# Patient Record
Sex: Female | Born: 1975 | Race: White | Hispanic: No | Marital: Single
Health system: Southern US, Community
[De-identification: ages and names within clinical notes are randomized; demographics above are authoritative.]

---

## 2019-02-25 ENCOUNTER — Other Ambulatory Visit: Payer: Self-pay

## 2019-02-25 DIAGNOSIS — Z20822 Contact with and (suspected) exposure to covid-19: Secondary | ICD-10-CM

## 2019-02-27 LAB — NOVEL CORONAVIRUS, NAA: SARS-CoV-2, NAA: DETECTED — AB

## 2021-01-10 ENCOUNTER — Other Ambulatory Visit: Payer: Self-pay | Admitting: Obstetrics and Gynecology

## 2021-01-10 DIAGNOSIS — Z1231 Encounter for screening mammogram for malignant neoplasm of breast: Secondary | ICD-10-CM

## 2021-01-18 ENCOUNTER — Other Ambulatory Visit: Payer: Self-pay

## 2021-01-18 ENCOUNTER — Ambulatory Visit
Admission: RE | Admit: 2021-01-18 | Discharge: 2021-01-18 | Disposition: A | Discharge status: Home / Self Care | Payer: 59 | Source: Ambulatory Visit | Attending: Obstetrics and Gynecology | Admitting: Obstetrics and Gynecology

## 2021-01-18 DIAGNOSIS — Z1231 Encounter for screening mammogram for malignant neoplasm of breast: Secondary | ICD-10-CM | POA: Diagnosis not present

## 2021-01-19 ENCOUNTER — Ambulatory Visit
Admission: RE | Admit: 2021-01-19 | Discharge: 2021-01-19 | Disposition: A | Discharge status: Home / Self Care | Payer: 59 | Source: Ambulatory Visit | Attending: Obstetrics and Gynecology | Admitting: Obstetrics and Gynecology

## 2021-01-19 ENCOUNTER — Other Ambulatory Visit: Payer: Self-pay | Admitting: Obstetrics and Gynecology

## 2021-01-19 DIAGNOSIS — N6320 Unspecified lump in the left breast, unspecified quadrant: Secondary | ICD-10-CM | POA: Insufficient documentation

## 2021-01-19 DIAGNOSIS — N6310 Unspecified lump in the right breast, unspecified quadrant: Secondary | ICD-10-CM | POA: Insufficient documentation

## 2021-01-19 DIAGNOSIS — N63 Unspecified lump in unspecified breast: Secondary | ICD-10-CM

## 2021-09-28 DIAGNOSIS — M5412 Radiculopathy, cervical region: Secondary | ICD-10-CM | POA: Diagnosis not present

## 2021-09-28 DIAGNOSIS — R7303 Prediabetes: Secondary | ICD-10-CM | POA: Diagnosis not present

## 2021-10-05 DIAGNOSIS — M5412 Radiculopathy, cervical region: Secondary | ICD-10-CM | POA: Diagnosis not present

## 2021-10-17 DIAGNOSIS — M542 Cervicalgia: Secondary | ICD-10-CM | POA: Diagnosis not present

## 2021-10-24 DIAGNOSIS — M542 Cervicalgia: Secondary | ICD-10-CM | POA: Diagnosis not present

## 2021-11-09 DIAGNOSIS — Z0000 Encounter for general adult medical examination without abnormal findings: Secondary | ICD-10-CM | POA: Diagnosis not present

## 2021-11-09 DIAGNOSIS — R7303 Prediabetes: Secondary | ICD-10-CM | POA: Diagnosis not present

## 2021-11-25 ENCOUNTER — Other Ambulatory Visit: Payer: Self-pay | Admitting: Family Medicine

## 2021-11-25 DIAGNOSIS — M5412 Radiculopathy, cervical region: Secondary | ICD-10-CM

## 2021-12-02 ENCOUNTER — Ambulatory Visit
Admission: RE | Admit: 2021-12-02 | Discharge: 2021-12-02 | Disposition: A | Discharge status: Home / Self Care | Payer: 59 | Source: Ambulatory Visit | Attending: Family Medicine | Admitting: Family Medicine

## 2021-12-02 DIAGNOSIS — R200 Anesthesia of skin: Secondary | ICD-10-CM | POA: Diagnosis not present

## 2021-12-02 DIAGNOSIS — M5412 Radiculopathy, cervical region: Secondary | ICD-10-CM

## 2022-03-01 ENCOUNTER — Emergency Department: Payer: 59

## 2022-03-01 ENCOUNTER — Other Ambulatory Visit: Payer: Self-pay

## 2022-03-01 ENCOUNTER — Emergency Department
Admission: EM | Admit: 2022-03-01 | Discharge: 2022-03-01 | Disposition: A | Discharge status: Home / Self Care | Payer: 59 | Attending: Emergency Medicine | Admitting: Emergency Medicine

## 2022-03-01 DIAGNOSIS — S6991XA Unspecified injury of right wrist, hand and finger(s), initial encounter: Secondary | ICD-10-CM | POA: Diagnosis not present

## 2022-03-01 DIAGNOSIS — W540XXA Bitten by dog, initial encounter: Secondary | ICD-10-CM | POA: Insufficient documentation

## 2022-03-01 DIAGNOSIS — S61451A Open bite of right hand, initial encounter: Secondary | ICD-10-CM | POA: Insufficient documentation

## 2022-03-01 DIAGNOSIS — S61252A Open bite of right middle finger without damage to nail, initial encounter: Secondary | ICD-10-CM | POA: Diagnosis not present

## 2022-03-01 MED ORDER — AMOXICILLIN-POT CLAVULANATE 875-125 MG PO TABS: 1.0000 | ORAL_TABLET | Freq: Two times a day (BID) | ORAL | 0 refills | Status: AC

## 2022-03-01 NOTE — Discharge Instructions (Addendum)
Please monitor closely for infection. If you are concerned, please return to the ER or see primary care.  Take the antibiotic until finished.

## 2022-03-01 NOTE — ED Triage Notes (Signed)
Pt here with a dog bite on her middle finger on her right hand. Pt states she was feeding a dog and it bit her on accident, pt not concerned about rabies. Pt only here to have her finger evaluated.

## 2022-03-01 NOTE — ED Provider Notes (Signed)
[facility] Provider Note    Event Date/Time   First MD Initiated Contact with Patient 03/01/22 1127     (approximate)   History   Animal Bite   HPI  Paula Wright is a 46 y.o. female with no significant past medical history and as listed in EMR presents to the emergency department for treatment and evaluation of pain to right middle finger after being bitten by a dog she is very familiar with. She was feeding it a pig ear and it didn't quite get it in his mouth, so he moved his head forward and bit down again catching her middle finger. Dog was not aggressive and acting normal. He was just excited to get the treat. She has no concern of rabies. Her tdap is current. Injury occurred yesterday. Pain is well controlled with tylenol and ibuprofen. Finger is mildly swollen today.      Physical Exam   Triage Vital Signs: ED Triage Vitals [03/01/22 1021]  Enc Vitals Group     BP 135/86     Pulse Rate (!) 105     Resp 18     Temp (!) 97.5 F (36.4 C)     Temp Source Oral     SpO2 92 %     Weight 190 lb (86.2 kg)     Height 5\' 4"  (1.626 m)     Head Circumference      Peak Flow      Pain Score 3     Pain Loc      Pain Edu?      Excl. in GC?     Most recent vital signs: Vitals:   03/01/22 1021 03/01/22 1234  BP: 135/86 129/84  Pulse: (!) 105 89  Resp: 18 16  Temp: (!) 97.5 F (36.4 C)   SpO2: 92% 99%    General: Awake, no distress.  CV:  Good peripheral perfusion.  Resp:  Normal effort.  Abd:  No distention.  Other:  Superficial appearing laceration to the nailfold of the right middle finger. No nail injury. Mild localized swelling without erythema. No drainage. ROM of finger decreased due to swelling, but able to demonstrate flexion and extension.   ED Results / Procedures / Treatments   Labs (all labs ordered are listed, but only abnormal results are displayed) Labs Reviewed - No data to display   EKG  Not  indicated.   RADIOLOGY  Image interpreted and radiology report reviewed by me.  Images of the right middle finger shows possible retained foreign body.  PROCEDURES:  Critical Care performed: No  Procedures   MEDICATIONS ORDERED IN ED: Medications - No data to display   IMPRESSION / MDM / ASSESSMENT AND PLAN / ED COURSE   I have reviewed the triage note.  Differential diagnosis includes, but is not limited to, dog bite wound, retained foreign body, tuft fracture.  46 year old female presenting to the emergency department for treatment and evaluation 1 day after being bitten by a dog.  See HPI for further details.  She reports Tdap is current.  Patient adamantly denies concern for rabies or the dog being aggressive.  Based on her report of the injury, no indication for rabies series.  Plan will be to get an image of the finger to ensure that there is no tuft fracture or retained foreign body.  Patient is aware and agreeable to the plan.  There is a radiopaque density on the image of the right middle  finger, however that does not appear related to the dog bite.  Plan will be to discharge the patient home with prescription for antibiotic.  She was encouraged to monitor the finger closely for any concern of infection.  If she notices any drainage, increase in erythema, increasing pain, or other concerns she is to either see primary care or return to the emergency department.        FINAL CLINICAL IMPRESSION(S) / ED DIAGNOSES   Final diagnoses:  Dog bite of right hand, initial encounter     Rx / DC Orders   ED Discharge Orders          Ordered    amoxicillin-clavulanate (AUGMENTIN) 875-125 MG tablet  2 times daily        03/01/22 1139             Note:  This document was prepared using Dragon voice recognition software and may include unintentional dictation errors.   Chinita Pester, FNP 03/01/22 1857    Concha Se, MD 03/03/22 1250

## 2023-06-03 IMAGING — MR MR CERVICAL SPINE W/O CM
4 of 5 series · 26 of 48 positions shown · non-contrast
Comparison: Report from radiographs of the cervical spine
10/05/2021 (images unavailable).

CLINICAL DATA: Provided history: Cervical radiculopathy. Right
shoulder and arm pain. Arm numbness.

EXAM:
MRI CERVICAL SPINE WITHOUT CONTRAST
TECHNIQUE: Multiplanar, multisequence MR imaging of the cervical spine was
performed. No intravenous contrast was administered.

[Series 6: T1 · sagittal · 3.0mm · 0.66mm/px · 6 of 15 slices shown]
[im 1/15]
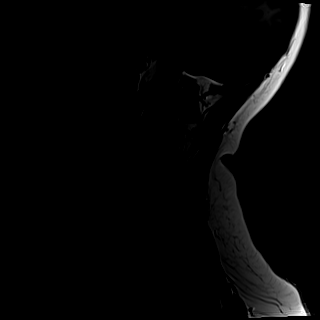
[im 3/15]
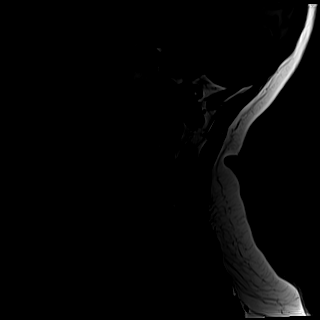
[im 6/15]
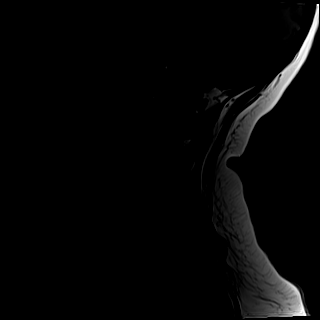
[im 9/15]
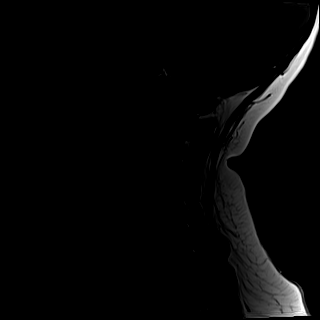
[im 12/15]
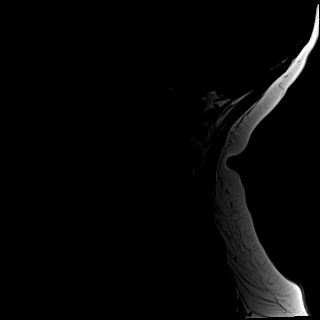
[im 15/15]
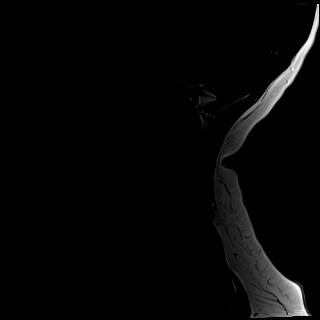

[Series 7: T2 · sagittal · 3.0mm · 0.55mm/px · 7 of 15 slices shown (1 of 2)]
[im 1/15]
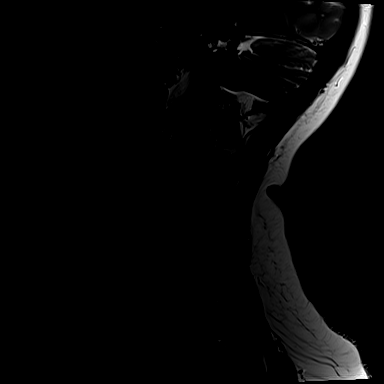
[im 3/15]
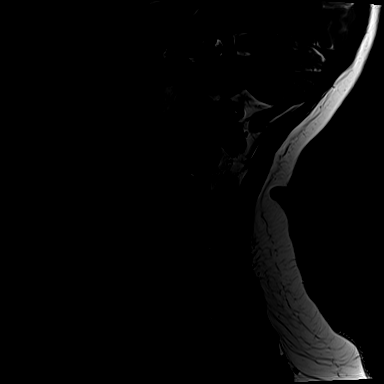
[im 5/15]
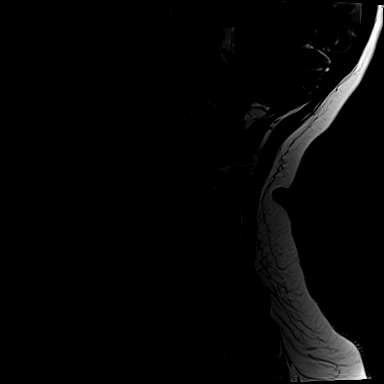
[im 8/15]
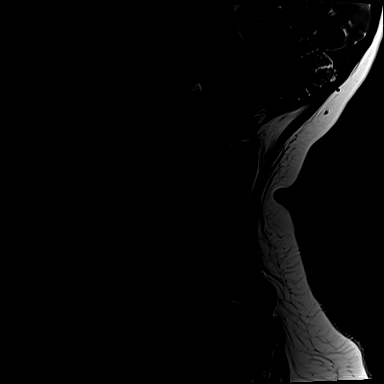
[im 10/15]
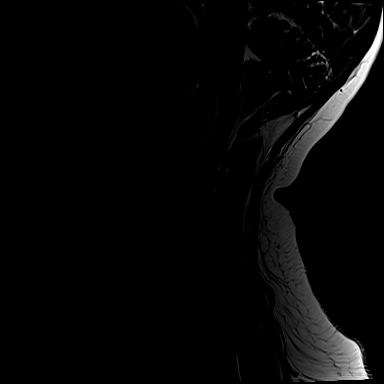
[im 12/15]
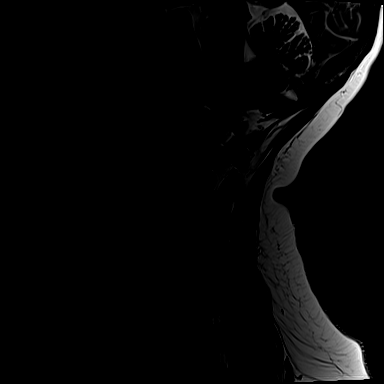
[im 15/15]
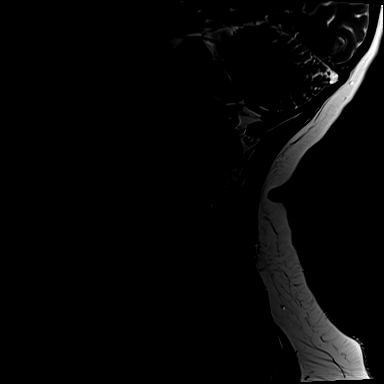

[Series 8: STIR · sagittal · 3.0mm · 0.33mm/px · 5 of 15 slices shown]
[im 1/15]
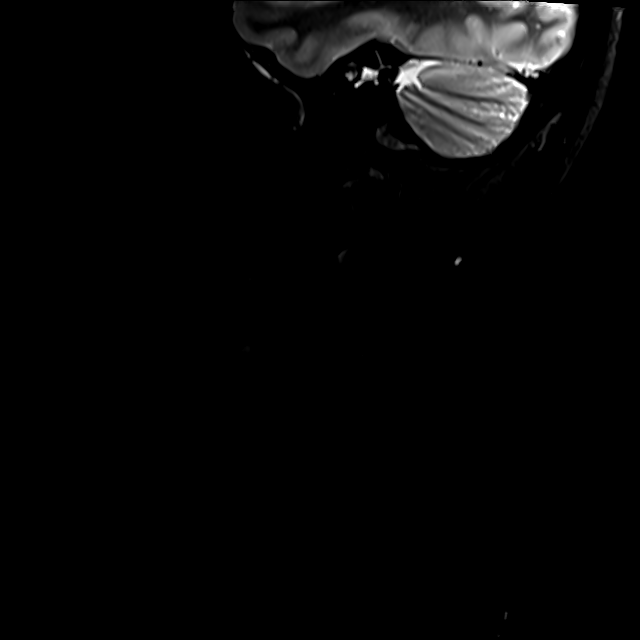
[im 3/15]
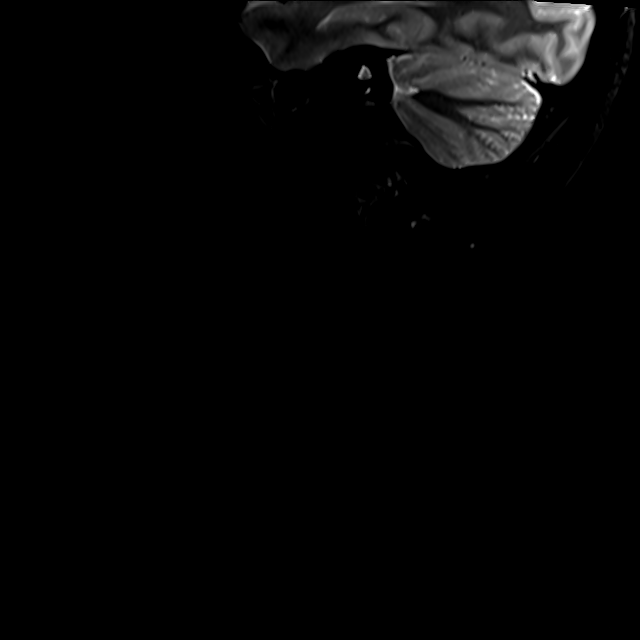
[im 5/15]
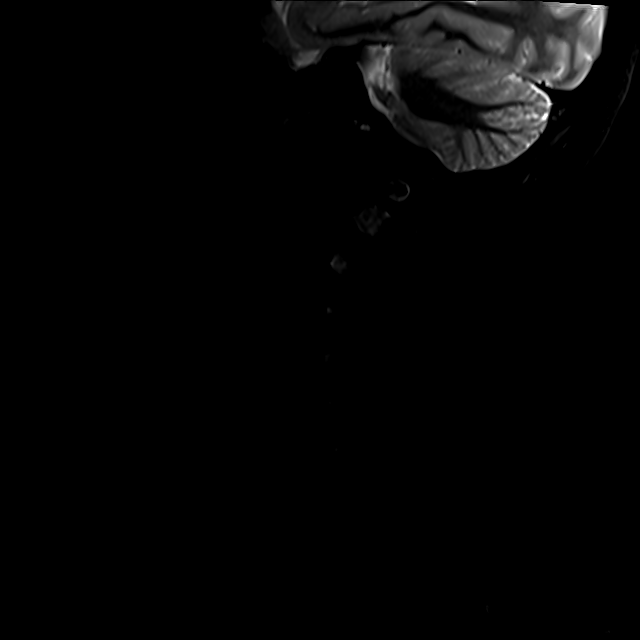
[im 8/15]
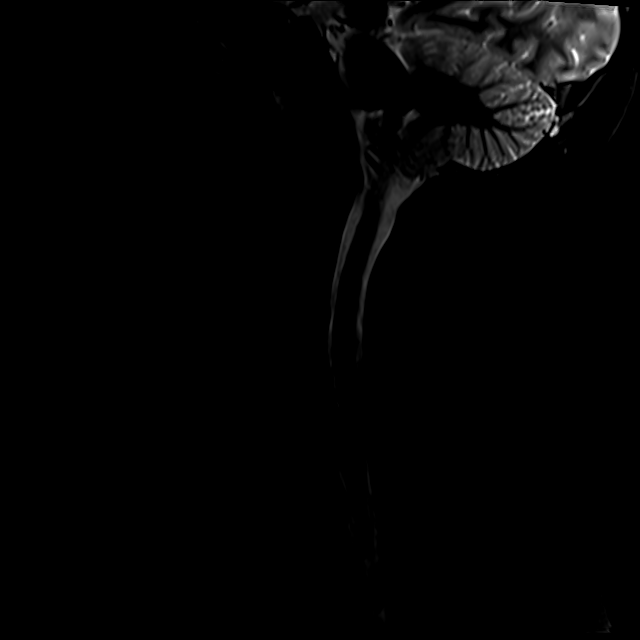
[im 12/15]
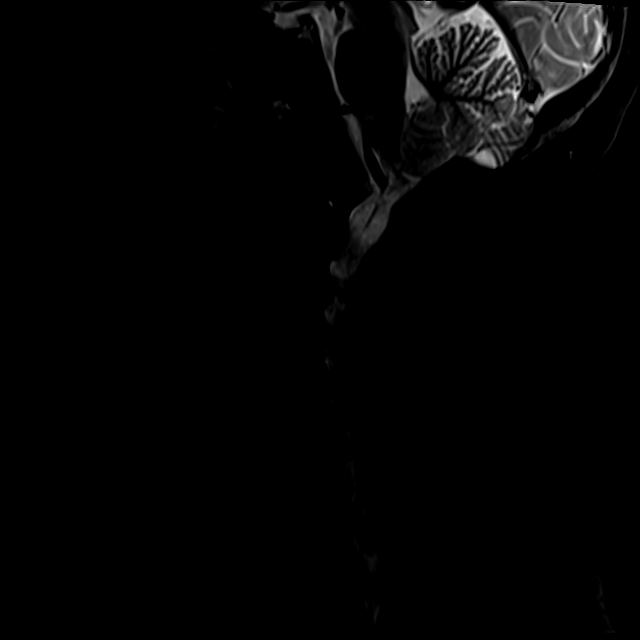

[Series 9: T2 · axial · 3.0mm · 0.50mm/px · z∈[-63,+33]mm · 8 of 32 slices shown (2 of 2)]
[im 1/32]
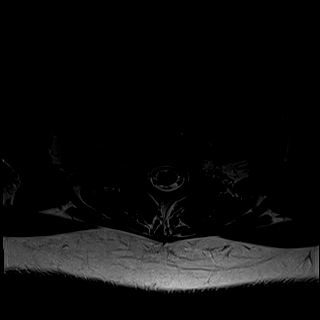
[im 5/32]
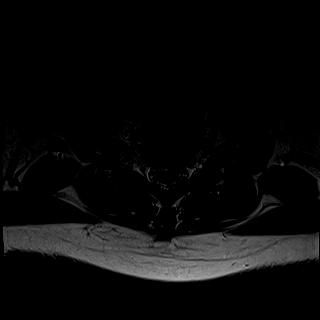
[im 10/32]
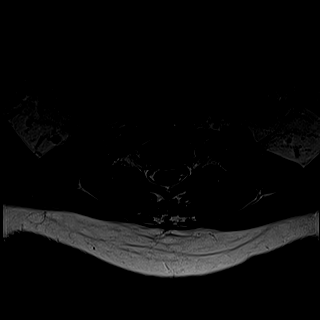
[im 15/32]
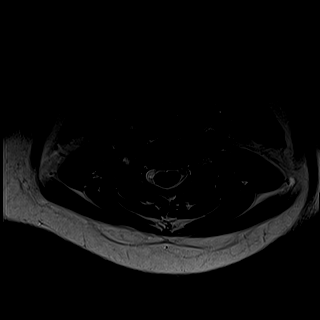
[im 17/32]
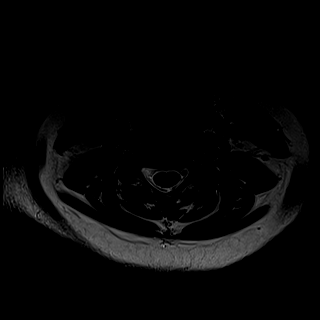
[im 22/32]
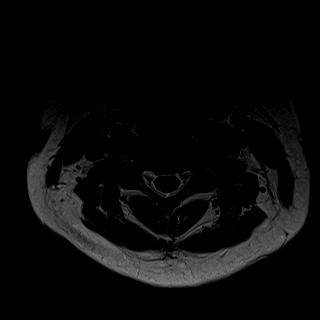
[im 27/32]
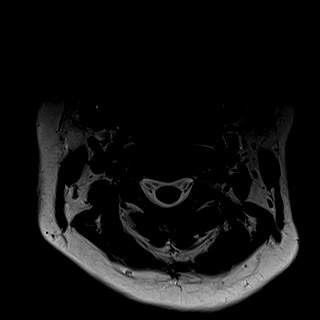
[im 32/32]
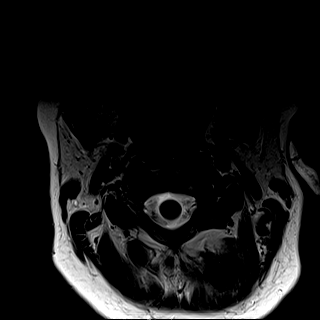

[26 of 48 positions shown; findings below may reference images not displayed]

FINDINGS: Alignment: Straightening of the expected cervical lordosis. No
significant spondylolisthesis.

Vertebrae: Vertebral body height is maintained. No significant
marrow edema or focal suspicious osseous lesion.

Cord: No signal abnormality identified within the cervical spinal
cord. Mild flattening of the ventral spinal cord at C6-C7, as
described below.

Posterior Fossa, vertebral arteries, paraspinal tissues: No
abnormality identified within included portions of the posterior
fossa. Flow voids preserved within the imaged cervical vertebral
arteries. No paraspinal mass or collection.

Disc levels:

Mild multilevel disc degeneration, greatest at C5-C6 and C6-C7.

C2-C3: No significant disc herniation or stenosis.

C3-C4: Shallow disc bulge. No significant spinal canal or foraminal
stenosis.

C4-C5: No significant disc herniation or stenosis.

C5-C6: Disc bulge. Superimposed right center/foraminal disc
extrusion with caudal migration to the mid C6 vertebral body level
(series 7, images 8-6) (series 9, images 20-22). The disc bulge and
disc protrusion result in mild spinal canal narrowing and contact
the ventral spinal cord. The disc extrusion could affect the right
C6 nerve root within the right C5-C6 foraminal entry zone, and/or
the right C7 nerve root within the right C6-C7 foraminal entry zone.
No significant left foraminal stenosis.

C6-C7: A small central disc protrusion mildly narrows the spinal
canal and contacts the ventral aspect of the spinal cord. No
significant foraminal stenosis.

C7-T1: Slight disc bulge. No significant spinal canal or foraminal
stenosis.
IMPRESSION: Cervical spondylosis, as outlined and with findings most notably as
follows.

At C5-C6, there is a disc bulge. Superimposed right center/foraminal
disc extrusion with caudal migration to the mid C6 vertebral body
level. The disc bulge and disc protrusion mildly narrow the spinal
canal and contact the ventral spinal cord. The disc extrusion could
affect the right C6 nerve root within the right C5-C6 foraminal
entry zone and/or the right C7 nerve root within the right C6-C7
foraminal entry zone. Correlate for right C6 and C7 radiculopathy.

At C6-C7, a small central disc protrusion mildly narrows the spinal
canal and contacts the ventral aspect of the spinal cord.

Nonspecific straightening of the expected cervical lordosis.

## 2023-10-23 DIAGNOSIS — R232 Flushing: Secondary | ICD-10-CM | POA: Diagnosis not present

## 2023-10-23 DIAGNOSIS — Z124 Encounter for screening for malignant neoplasm of cervix: Secondary | ICD-10-CM | POA: Diagnosis not present

## 2023-10-23 DIAGNOSIS — N888 Other specified noninflammatory disorders of cervix uteri: Secondary | ICD-10-CM | POA: Diagnosis not present

## 2023-10-23 DIAGNOSIS — N921 Excessive and frequent menstruation with irregular cycle: Secondary | ICD-10-CM | POA: Diagnosis not present

## 2023-10-30 DIAGNOSIS — N921 Excessive and frequent menstruation with irregular cycle: Secondary | ICD-10-CM | POA: Diagnosis not present

## 2024-01-03 DIAGNOSIS — Z0000 Encounter for general adult medical examination without abnormal findings: Secondary | ICD-10-CM | POA: Diagnosis not present

## 2024-01-03 DIAGNOSIS — Z6836 Body mass index (BMI) 36.0-36.9, adult: Secondary | ICD-10-CM | POA: Diagnosis not present

## 2024-01-03 DIAGNOSIS — Z1331 Encounter for screening for depression: Secondary | ICD-10-CM | POA: Diagnosis not present

## 2024-01-03 DIAGNOSIS — E7800 Pure hypercholesterolemia, unspecified: Secondary | ICD-10-CM | POA: Diagnosis not present

## 2024-01-03 DIAGNOSIS — R7303 Prediabetes: Secondary | ICD-10-CM | POA: Diagnosis not present

## 2024-01-03 DIAGNOSIS — E66812 Obesity, class 2: Secondary | ICD-10-CM | POA: Diagnosis not present

## 2024-02-15 DIAGNOSIS — R0981 Nasal congestion: Secondary | ICD-10-CM | POA: Diagnosis not present

## 2024-03-11 DIAGNOSIS — R9389 Abnormal findings on diagnostic imaging of other specified body structures: Secondary | ICD-10-CM | POA: Diagnosis not present

## 2024-03-11 DIAGNOSIS — N921 Excessive and frequent menstruation with irregular cycle: Secondary | ICD-10-CM | POA: Diagnosis not present

## 2024-03-11 DIAGNOSIS — Z01818 Encounter for other preprocedural examination: Secondary | ICD-10-CM | POA: Diagnosis not present

## 2024-03-18 DIAGNOSIS — N926 Irregular menstruation, unspecified: Secondary | ICD-10-CM | POA: Diagnosis not present

## 2024-03-18 DIAGNOSIS — N921 Excessive and frequent menstruation with irregular cycle: Secondary | ICD-10-CM | POA: Diagnosis not present

## 2024-03-18 DIAGNOSIS — Z30430 Encounter for insertion of intrauterine contraceptive device: Secondary | ICD-10-CM | POA: Diagnosis not present

## 2024-03-18 DIAGNOSIS — R9389 Abnormal findings on diagnostic imaging of other specified body structures: Secondary | ICD-10-CM | POA: Diagnosis not present
